# Patient Record
Sex: Female | Born: 1980 | Hispanic: Yes | Marital: Married | Smoking: Never smoker
Health system: Southern US, Community
[De-identification: ages and names within clinical notes are randomized; demographics above are authoritative.]

---

## 2007-03-30 ENCOUNTER — Emergency Department: Payer: Self-pay | Admitting: Emergency Medicine

## 2010-10-18 ENCOUNTER — Ambulatory Visit: Payer: Self-pay | Admitting: Family Medicine

## 2011-02-22 ENCOUNTER — Ambulatory Visit: Payer: Self-pay | Admitting: Advanced Practice Midwife

## 2011-04-06 ENCOUNTER — Inpatient Hospital Stay: Payer: Self-pay | Admitting: Obstetrics and Gynecology

## 2013-11-20 LAB — OB RESULTS CONSOLE HGB/HCT, BLOOD: Hemoglobin: 12.2 g/dL

## 2014-07-03 LAB — OB RESULTS CONSOLE ANTIBODY SCREEN: ANTIBODY SCREEN: NEGATIVE

## 2014-07-03 LAB — OB RESULTS CONSOLE ABO/RH: RH TYPE: POSITIVE

## 2014-07-08 LAB — OB RESULTS CONSOLE HGB/HCT, BLOOD: HEMOGLOBIN: 13.1 g/dL

## 2014-07-09 LAB — OB RESULTS CONSOLE RPR: RPR: NONREACTIVE

## 2014-07-09 LAB — OB RESULTS CONSOLE HEPATITIS B SURFACE ANTIGEN: Hepatitis B Surface Ag: NEGATIVE

## 2014-07-09 LAB — OB RESULTS CONSOLE PLATELET COUNT: Platelets: 293 10*3/uL

## 2014-08-24 ENCOUNTER — Ambulatory Visit: Payer: Self-pay | Admitting: Advanced Practice Midwife

## 2014-08-26 NOTE — L&D Delivery Note (Signed)
VAGINAL DELIVERY NOTE:  Date of Delivery: 02/02/2015 Primary OB: [facility]  Gestational Age/EDD: [redacted]w[redacted]d 02/15/2015, by Last Menstrual Period Antepartum complications: none Attending Physician: TJ Schermerhorn Delivery Type: spontaneous vaginal delivery  Anesthesia: none Laceration: none Episiotomy: none Placenta: spontaneous Intrapartum complications: None Estimated Blood Loss: GBS: negative Procedure Details:Cx ant lip and pt began to push and prepared for delivery and poss shoulder dystocia with 2 RN;s at bedside, Nsy nurse here and Peds available in next room, Interpreter with [place] and pt put in [place] and enc to push and the lip moved easily and baby delivered in 3 pushes with vtx presenting and ant shoulder in one movement, No CAN, post shoulder and body del. To mom's abd. CCx2 and cut after cord stopped pulsating. 3 VC noted. SDOP intact with fundus firm. Pt tol well. No lacs. No sponges or needels. Pt was cathed for 300 ml prior to del. Bleeding controlled.   Baby: Liveborn female, Apgars 9-9 weight 7 #, 8oz, baby named Namias

## 2014-11-22 LAB — OB RESULTS CONSOLE RPR: RPR: NONREACTIVE

## 2014-11-22 LAB — OB RESULTS CONSOLE GBS: GBS: NEGATIVE

## 2014-11-28 ENCOUNTER — Ambulatory Visit
Admit: 2014-11-28 | Disposition: A | Discharge status: Home / Self Care | Payer: Self-pay | Attending: Advanced Practice Midwife | Admitting: Advanced Practice Midwife

## 2015-01-03 ENCOUNTER — Other Ambulatory Visit: Payer: Self-pay | Admitting: Advanced Practice Midwife

## 2015-01-10 ENCOUNTER — Other Ambulatory Visit: Payer: Self-pay | Admitting: Advanced Practice Midwife

## 2015-01-10 DIAGNOSIS — O09299 Supervision of pregnancy with other poor reproductive or obstetric history, unspecified trimester: Secondary | ICD-10-CM

## 2015-01-10 DIAGNOSIS — Z3689 Encounter for other specified antenatal screening: Secondary | ICD-10-CM

## 2015-01-17 LAB — OB RESULTS CONSOLE GC/CHLAMYDIA
CHLAMYDIA, DNA PROBE: NEGATIVE
Gonorrhea: NEGATIVE

## 2015-01-17 LAB — OB RESULTS CONSOLE GBS: STREP GROUP B AG: NEGATIVE

## 2015-02-01 ENCOUNTER — Inpatient Hospital Stay
Admission: EM | Admit: 2015-02-01 | Discharge: 2015-02-04 | DRG: 775 | Disposition: A | Discharge status: Home / Self Care | Payer: Medicaid Other | Attending: Obstetrics and Gynecology | Admitting: Obstetrics and Gynecology

## 2015-02-01 ENCOUNTER — Emergency Department
Admission: EM | Admit: 2015-02-01 | Discharge: 2015-02-01 | Discharge status: Absent without leave | Payer: PRIVATE HEALTH INSURANCE

## 2015-02-01 ENCOUNTER — Ambulatory Visit
Admission: RE | Admit: 2015-02-01 | Discharge: 2015-02-01 | Disposition: A | Discharge status: Home / Self Care | Payer: Self-pay | Source: Ambulatory Visit | Attending: Advanced Practice Midwife | Admitting: Advanced Practice Midwife

## 2015-02-01 DIAGNOSIS — O3663X0 Maternal care for excessive fetal growth, third trimester, not applicable or unspecified: Secondary | ICD-10-CM | POA: Insufficient documentation

## 2015-02-01 DIAGNOSIS — Z3A38 38 weeks gestation of pregnancy: Secondary | ICD-10-CM | POA: Diagnosis present

## 2015-02-01 DIAGNOSIS — O09299 Supervision of pregnancy with other poor reproductive or obstetric history, unspecified trimester: Secondary | ICD-10-CM

## 2015-02-01 DIAGNOSIS — Z550 Illiteracy and low-level literacy: Secondary | ICD-10-CM

## 2015-02-01 DIAGNOSIS — O4103X0 Oligohydramnios, third trimester, not applicable or unspecified: Principal | ICD-10-CM | POA: Diagnosis present

## 2015-02-01 DIAGNOSIS — O4100X0 Oligohydramnios, unspecified trimester, not applicable or unspecified: Secondary | ICD-10-CM | POA: Diagnosis present

## 2015-02-01 LAB — TYPE AND SCREEN
ABO/RH(D): A POS
Antibody Screen: NEGATIVE

## 2015-02-01 LAB — CBC
HCT: 39.3 % (ref 35.0–47.0)
Hemoglobin: 12.8 g/dL (ref 12.0–16.0)
MCH: 29.5 pg (ref 26.0–34.0)
MCHC: 32.7 g/dL (ref 32.0–36.0)
MCV: 90.2 fL (ref 80.0–100.0)
PLATELETS: 183 10*3/uL (ref 150–440)
RBC: 4.35 MIL/uL (ref 3.80–5.20)
RDW: 15.2 % — AB (ref 11.5–14.5)
WBC: 11.4 10*3/uL — ABNORMAL HIGH (ref 3.6–11.0)

## 2015-02-01 LAB — ABO/RH: ABO/RH(D): A POS

## 2015-02-01 MED ORDER — TERBUTALINE SULFATE 1 MG/ML IJ SOLN: 0.2500 mg | Freq: Once | INTRAMUSCULAR | Status: AC | PRN

## 2015-02-01 MED ORDER — CITRIC ACID-SODIUM CITRATE 334-500 MG/5ML PO SOLN: 30.0000 mL | ORAL | Status: DC | PRN

## 2015-02-01 MED ORDER — DINOPROSTONE 10 MG VA INST
10.0000 mg | VAGINAL_INSERT | Freq: Once | VAGINAL | Status: AC
  Administered 2015-02-01: 10 mg via VAGINAL
  Filled 2015-02-01 (×2): qty 1

## 2015-02-01 MED ORDER — LACTATED RINGERS IV SOLN
INTRAVENOUS | Status: DC
  Administered 2015-02-01 – 2015-02-02 (×2): via INTRAVENOUS

## 2015-02-01 MED ORDER — BUTORPHANOL TARTRATE 1 MG/ML IJ SOLN
1.0000 mg | INTRAMUSCULAR | Status: DC | PRN
  Administered 2015-02-02: 1 mg via INTRAVENOUS

## 2015-02-01 MED ORDER — ZOLPIDEM TARTRATE 5 MG PO TABS: 5.0000 mg | ORAL_TABLET | Freq: Every evening | ORAL | Status: DC | PRN

## 2015-02-01 MED ORDER — OXYTOCIN BOLUS FROM INFUSION: 500.0000 mL | INTRAVENOUS | Status: DC

## 2015-02-01 MED ORDER — ACETAMINOPHEN 325 MG PO TABS: 650.0000 mg | ORAL_TABLET | ORAL | Status: DC | PRN

## 2015-02-01 MED ORDER — LACTATED RINGERS IV SOLN: 500.0000 mL | INTRAVENOUS | Status: DC | PRN

## 2015-02-01 MED ORDER — OXYTOCIN 40 UNITS IN LACTATED RINGERS INFUSION - SIMPLE MED
1.0000 m[IU]/min | INTRAVENOUS | Status: DC
  Administered 2015-02-02: 1 m[IU]/min via INTRAVENOUS

## 2015-02-01 MED ORDER — OXYTOCIN 40 UNITS IN LACTATED RINGERS INFUSION - SIMPLE MED
62.5000 mL/h | INTRAVENOUS | Status: DC
  Administered 2015-02-02: 62.5 mL/h via INTRAVENOUS

## 2015-02-01 MED ORDER — PRENATAL MULTIVITAMIN CH
1.0000 | ORAL_TABLET | Freq: Every day | ORAL | Status: DC
  Filled 2015-02-01 (×2): qty 1

## 2015-02-01 MED ORDER — OXYCODONE-ACETAMINOPHEN 5-325 MG PO TABS: 2.0000 | ORAL_TABLET | ORAL | Status: DC | PRN

## 2015-02-01 MED ORDER — ONDANSETRON HCL 4 MG/2ML IJ SOLN: 4.0000 mg | Freq: Four times a day (QID) | INTRAMUSCULAR | Status: DC | PRN

## 2015-02-01 MED ORDER — OXYCODONE-ACETAMINOPHEN 5-325 MG PO TABS: 1.0000 | ORAL_TABLET | ORAL | Status: DC | PRN

## 2015-02-01 MED ORDER — MISOPROSTOL 25 MCG QUARTER TABLET: 25.0000 ug | ORAL_TABLET | ORAL | Status: DC | PRN

## 2015-02-01 MED ORDER — LIDOCAINE HCL (PF) 1 % IJ SOLN: 30.0000 mL | INTRAMUSCULAR | Status: DC | PRN

## 2015-02-01 NOTE — H&P (Signed)
Caitlin Ramsey is a 34 y.o. female presenting for oligohydramnios with AFI 4.7cm.  Prior hx of shoulder dystocia with 9#13oz baby, and was getting growth scan today . Maternal Medical History:  Reason for admission: Nausea.  Fetal activity: Perceived fetal activity is normal.   Last perceived fetal movement was within the past hour.    Prenatal complications: Oligohydramnios.   Prenatal Complications - Diabetes: none.    OB History    Gravida Para Term Preterm AB TAB SAB Ectopic Multiple Living   7 4 4  2  2   4      No past medical history on file. No past surgical history on file. Family History: family history is not on file. Social History:  reports that she has never smoked. She does not have any smokeless tobacco history on file. She reports that she does not drink alcohol or use illicit drugs.   Prenatal Transfer Tool  Maternal Diabetes: No Genetic Screening: Normal Maternal Ultrasounds/Referrals: Abnormal:  Findings:   Other: oligohydramnios Fetal Ultrasounds or other Referrals:  None Maternal Substance Abuse:  No Significant Maternal Medications:  None Significant Maternal Lab Results:  None Other Comments:  None  Review of Systems  Constitutional: Negative for fever and chills.  Eyes: Negative for blurred vision and double vision.  Respiratory: Negative for shortness of breath.   Cardiovascular: Negative for chest pain and palpitations.  Gastrointestinal: Negative for nausea, vomiting, abdominal pain, diarrhea and constipation.  Genitourinary: Negative for dysuria, urgency, frequency and flank pain.  Neurological: Negative for headaches.  Psychiatric/Behavioral: Negative for depression.    Dilation: 3 Effacement (%): 50 Station: -3 Consistency: moderate Position: mid-position Modified Bishop score: 5  Exam by:: Virgel ManifoldB. Mairlyn Tegtmeyer, MD Blood pressure 114/60, pulse 109, temperature 97.9 F (36.6 C), temperature source Oral, resp. rate 18. Maternal  Exam:  Abdomen: Patient reports no abdominal tenderness. Estimated fetal weight is 3200.   Fetal presentation: vertex  Introitus: Normal vulva. Normal vagina.  Pelvis: adequate for delivery.   Cervix: Cervix evaluated by digital exam.     Physical Exam  Constitutional: She is oriented to person, place, and time. She appears well-developed and well-nourished. No distress.  Eyes: No scleral icterus.  Neck: Normal range of motion. Neck supple.  Cardiovascular: Normal rate.   Respiratory: Effort normal. No respiratory distress.  GI: Soft. She exhibits no distension. There is no tenderness.  Genitourinary: Vagina normal and uterus normal.  Musculoskeletal: Normal range of motion.  Neurological: She is alert and oriented to person, place, and time.  Skin: Skin is warm and dry.  Psychiatric: She has a normal mood and affect.    Prenatal labs: ABO, Rh: A/Positive/-- (11/08 0000) Antibody: Negative (11/08 0000) Rubella:   unknown  RPR: Nonreactive (03/29 0000)  HBsAg:   negative HIV:   drawn GBS: Negative (03/29 0000)   Assessment/Plan: Induction of labor for oligohydramnios with AFI 4.7cm Cervical ripening with Cervadil Pitocin titration Stadol 1-2mg  q1-2 hrs prn May have epidural when she wants it If CST fails, pt is aware that C/S will be indicated for failure to tolerate labor  Seen and consented with Spanish interpreter  Christeen DouglasBEASLEY, Rosie Golson 02/01/2015, 9:16 PM

## 2015-02-01 NOTE — ED Notes (Signed)
Per interp. US called pt to come here for an induction after seeing her low fluid levels on the US. L&D is calling me back because they do not have her down for an induction tonight or tomorrow.

## 2015-02-02 MED ORDER — TETANUS-DIPHTH-ACELL PERTUSSIS 5-2.5-18.5 LF-MCG/0.5 IM SUSP
0.5000 mL | Freq: Once | INTRAMUSCULAR | Status: DC
  Filled 2015-02-02: qty 0.5

## 2015-02-02 MED ORDER — SODIUM CHLORIDE 0.9 % IV SOLN: 250.0000 mL | INTRAVENOUS | Status: DC | PRN

## 2015-02-02 MED ORDER — LIDOCAINE HCL (PF) 1 % IJ SOLN
INTRAMUSCULAR | Status: AC
  Filled 2015-02-02: qty 30

## 2015-02-02 MED ORDER — WITCH HAZEL-GLYCERIN EX PADS
1.0000 | MEDICATED_PAD | CUTANEOUS | Status: DC | PRN
Start: 2015-02-02 — End: 2015-02-04

## 2015-02-02 MED ORDER — DIPHENHYDRAMINE HCL 25 MG PO CAPS: 25.0000 mg | ORAL_CAPSULE | Freq: Four times a day (QID) | ORAL | Status: DC | PRN

## 2015-02-02 MED ORDER — BENZOCAINE-MENTHOL 20-0.5 % EX AERO: 1.0000 "application " | INHALATION_SPRAY | CUTANEOUS | Status: DC | PRN

## 2015-02-02 MED ORDER — BISACODYL 10 MG RE SUPP: 10.0000 mg | Freq: Every day | RECTAL | Status: DC | PRN

## 2015-02-02 MED ORDER — OXYCODONE-ACETAMINOPHEN 5-325 MG PO TABS: 2.0000 | ORAL_TABLET | ORAL | Status: DC | PRN

## 2015-02-02 MED ORDER — ACETAMINOPHEN 325 MG PO TABS: 650.0000 mg | ORAL_TABLET | ORAL | Status: DC | PRN

## 2015-02-02 MED ORDER — OXYTOCIN 40 UNITS IN LACTATED RINGERS INFUSION - SIMPLE MED
INTRAVENOUS | Status: AC
  Filled 2015-02-02: qty 1000

## 2015-02-02 MED ORDER — PRENATAL MULTIVITAMIN CH
1.0000 | ORAL_TABLET | Freq: Every day | ORAL | Status: DC
  Administered 2015-02-03 – 2015-02-04 (×2): 1 via ORAL
  Filled 2015-02-02 (×3): qty 1

## 2015-02-02 MED ORDER — SODIUM CHLORIDE 0.9 % IJ SOLN: 3.0000 mL | INTRAMUSCULAR | Status: DC | PRN

## 2015-02-02 MED ORDER — SENNOSIDES-DOCUSATE SODIUM 8.6-50 MG PO TABS
2.0000 | ORAL_TABLET | ORAL | Status: DC
  Administered 2015-02-04: 2 via ORAL
  Filled 2015-02-02 (×3): qty 2

## 2015-02-02 MED ORDER — OXYCODONE-ACETAMINOPHEN 5-325 MG PO TABS: 1.0000 | ORAL_TABLET | ORAL | Status: DC | PRN

## 2015-02-02 MED ORDER — BUTORPHANOL TARTRATE 1 MG/ML IJ SOLN: 1.0000 mg | INTRAMUSCULAR | Status: DC | PRN

## 2015-02-02 MED ORDER — DIBUCAINE 1 % RE OINT: 1.0000 "application " | TOPICAL_OINTMENT | RECTAL | Status: DC | PRN

## 2015-02-02 MED ORDER — SODIUM CHLORIDE 0.9 % IJ SOLN: 3.0000 mL | Freq: Two times a day (BID) | INTRAMUSCULAR | Status: DC

## 2015-02-02 MED ORDER — SIMETHICONE 80 MG PO CHEW
80.0000 mg | CHEWABLE_TABLET | ORAL | Status: DC | PRN
  Filled 2015-02-02: qty 1

## 2015-02-02 MED ORDER — ONDANSETRON HCL 4 MG PO TABS
4.0000 mg | ORAL_TABLET | ORAL | Status: DC | PRN
  Filled 2015-02-02: qty 1

## 2015-02-02 MED ORDER — ONDANSETRON HCL 4 MG/2ML IJ SOLN: 4.0000 mg | INTRAMUSCULAR | Status: DC | PRN

## 2015-02-02 MED ORDER — MEASLES, MUMPS & RUBELLA VAC ~~LOC~~ INJ
0.5000 mL | INJECTION | Freq: Once | SUBCUTANEOUS | Status: DC
  Filled 2015-02-02: qty 0.5

## 2015-02-02 MED ORDER — BUTORPHANOL TARTRATE 1 MG/ML IJ SOLN
INTRAMUSCULAR | Status: AC
  Administered 2015-02-02: 1 mg via INTRAVENOUS
  Filled 2015-02-02: qty 1

## 2015-02-02 MED ORDER — MISOPROSTOL 200 MCG PO TABS
ORAL_TABLET | ORAL | Status: AC
  Filled 2015-02-02: qty 4

## 2015-02-02 MED ORDER — OXYTOCIN 10 UNIT/ML IJ SOLN
INTRAMUSCULAR | Status: AC
  Filled 2015-02-02: qty 2

## 2015-02-02 MED ORDER — ZOLPIDEM TARTRATE 5 MG PO TABS: 5.0000 mg | ORAL_TABLET | Freq: Every evening | ORAL | Status: DC | PRN

## 2015-02-02 MED ORDER — LANOLIN HYDROUS EX OINT: TOPICAL_OINTMENT | CUTANEOUS | Status: DC | PRN

## 2015-02-02 MED ORDER — AMMONIA AROMATIC IN INHA
RESPIRATORY_TRACT | Status: AC
  Filled 2015-02-02: qty 10

## 2015-02-02 MED ORDER — LACTATED RINGERS IV SOLN: INTRAVENOUS | Status: DC

## 2015-02-02 MED ORDER — OXYTOCIN 40 UNITS IN LACTATED RINGERS INFUSION - SIMPLE MED: 62.5000 mL/h | INTRAVENOUS | Status: DC | PRN

## 2015-02-02 MED ORDER — FLEET ENEMA 7-19 GM/118ML RE ENEM: 1.0000 | ENEMA | Freq: Every day | RECTAL | Status: DC | PRN

## 2015-02-02 MED ORDER — IBUPROFEN 600 MG PO TABS
600.0000 mg | ORAL_TABLET | Freq: Four times a day (QID) | ORAL | Status: DC
  Administered 2015-02-03 – 2015-02-04 (×6): 600 mg via ORAL
  Filled 2015-02-02 (×7): qty 1

## 2015-02-02 NOTE — Progress Notes (Signed)
Recvd per shift report.  Up to BR with no assist.  Back to bed.  EFM continues.  Shift assessment performed.

## 2015-02-02 NOTE — Progress Notes (Signed)
Shacarra Foat is a 34 y.o. [id number] at [redacted]w[redacted]d by LMP admitted for induction of labor due to Low amniotic fluid..[place] correlates with EDD.  Subjective: No pain with UC's  Objective: BP 102/58 mmHg  Pulse 93  Temp(Src) 97.9 F (36.6 C) (Oral)  Resp 14  Ht 5' 4.5" (1.638 m)  Wt 83.462 kg (184 lb)  BMI 31.11 kg/m2  LMP 05/11/2014      FHT:  FHR: 150, accels tp 170 noted, Cat I bpm, variability: moderate,  accelerations:  Present,  decelerations:  Absent UC:   regular, every 7  minutes SVE:   Dilation: 3 Effacement (%): 50 Exam by:: Eugene Garnet, RN  Labs: Lab Results  Component Value Date   WBC 11.4* 02/01/2015   HGB 12.8 02/01/2015   HCT 39.3 02/01/2015   MCV 90.2 02/01/2015   PLT 183 02/01/2015    Assessment / Plan: Induction of labor due to Oligo,  progressing well on pitocin  Labor: Progressing normally Preeclampsia:  no signs or symptoms of toxicity Fetal Wellbeing:  Category I Pain Control:  Labor support without medications I/D:  n/a Anticipated MOD:  NSVD  Disc with Dr Feliberto Gottron and Dr Dalbert Garnet and agree with proceeding with IOL for Oligo and vaginal delivery. Interpreter, Maryjane Hurter, discussed with CNM and pt the risks of vaginal delivery due to previous hx of shoulder dystocia with a 9#13oz baby. [place] yest. Showed a 4000 gm baby and EFW by Dr Dalbert Garnet and CNM estimated at 6#14oz. Pt states she does not feel as big as last pregnancy. Pt was given and option to delivery via LTCS to avoid any complications with poss shoulder dystocia including HIE, brachial plexus, arm deformity, fx of shoulder, fetal anoxia, extensive perineal injury due to shoulder dystocia and other complications. Pt is aware of the risks and desires to proceed to continuing vag delivery options. Cervidil is in place and pt is contracting. All MD's, nursing and CNM are on board to provide pt's desire to delivery SVD. Pt stated that if she has to have a C-section for any reason, she is unsure whether  she wants her tubes tied and will discuss with partner.    Milon Score W 02/02/2015, 8:34 AM

## 2015-02-02 NOTE — Progress Notes (Signed)
Caitlin Ramsey is a 34 y.o. [id number] at [redacted]w[redacted]d by LMP admitted for induction of labor due to Low amniotic fluid..  Subjective:   Objective: BP 123/56 mmHg  Pulse 94  Temp(Src) 98.3 F (36.8 C) (Oral)  Resp 18  Ht 5' 4.5" (1.638 m)  Wt 83.462 kg (184 lb)  BMI 31.11 kg/m2  LMP 05/11/2014      FHT:  FHR: 150, mod variabilty, no decels. + accels bpm, variability: moderate,  accelerations:  Present,  decelerations:  Absent UC:   irregular, every 1-2 minutes, 8 on 1-10 scale per pt. Lasting 40-60 secs SVE:   Dilation: 5 Effacement (%): 70 Station: -1 Exam by:: Beatriz Stallion, CNM  Labs: Lab Results  Component Value Date   WBC 11.4* 02/01/2015   HGB 12.8 02/01/2015   HCT 39.3 02/01/2015   MCV 90.2 02/01/2015   PLT 183 02/01/2015    Assessment / Plan: Augmentation of labor, progressing well  Labor: Progressing normally Preeclampsia:  no signs or symptoms of toxicity Fetal Wellbeing:  Category I Pain Control:  Labor support without medications I/D:  n/a Anticipated MOD:  NSVD  Dr. Feliberto Gottron aware of pt status. Pitocin increased to 6 (    Mun/min)  Caitlin Ramsey 02/02/2015, 7:24 PM

## 2015-02-02 NOTE — Progress Notes (Addendum)
Caitlin Ramsey is a 34 y.o. [id number] at [redacted]w[redacted]d by LMP admitted for induction of labor due to Oligohydramnios.  Subjective: No complaints.  Objective: BP 113/55 mmHg  Pulse 91  Temp(Src) 98.7 F (37.1 C) (Oral)  Resp 16  Ht 5' 4.5" (1.638 m)  Wt 83.462 kg (184 lb)  BMI 31.11 kg/m2  LMP 05/11/2014      FHT:  FHR: 150, Cat 2 Variable decels after AROM to 100 x 20 secs, +accels prior to variables bpm, variability: moderate,  accelerations:  Present,  decelerations:  Present variables x 2 noted UC:   regular, every 2 minutes SVE:   Dilation: 3 Effacement (%): 60 Station: -2 Exam by:: Cjones, CNM  Labs: Lab Results  Component Value Date   WBC 11.4* 02/01/2015   HGB 12.8 02/01/2015   HCT 39.3 02/01/2015   MCV 90.2 02/01/2015   PLT 183 02/01/2015    Assessment / Plan: Augmentation of labor, progressing well  Labor: Progressing normally Preeclampsia:  none Fetal Wellbeing:  Category II Pain Control:  none I/D:  n/a Anticipated MOD:  NSVD  AROM for clear amt of fluid (small amt) noted on AROM with IFSE. FHR 140 after AROM.   Milon Score W 02/02/2015, 12:27 PM

## 2015-02-03 LAB — CBC
HCT: 38.4 % (ref 35.0–47.0)
Hemoglobin: 12.7 g/dL (ref 12.0–16.0)
MCH: 29.9 pg (ref 26.0–34.0)
MCHC: 33.2 g/dL (ref 32.0–36.0)
MCV: 90.1 fL (ref 80.0–100.0)
PLATELETS: 174 10*3/uL (ref 150–440)
RBC: 4.26 MIL/uL (ref 3.80–5.20)
RDW: 14.9 % — ABNORMAL HIGH (ref 11.5–14.5)
WBC: 20.8 10*3/uL — ABNORMAL HIGH (ref 3.6–11.0)

## 2015-02-03 LAB — RPR: RPR: NONREACTIVE

## 2015-02-03 LAB — HIV ANTIBODY (ROUTINE TESTING W REFLEX): HIV SCREEN 4TH GENERATION: NONREACTIVE

## 2015-02-03 NOTE — Progress Notes (Signed)
Post Partum Day 1 Subjective: no complaints  Objective: Blood pressure 96/53, pulse 79, temperature 98.7 F (37.1 C), temperature source Oral, resp. rate 20, height 5' 4.5" (1.638 m), weight 83.462 kg (184 lb), last menstrual period 05/11/2014, unknown if currently breastfeeding.  Physical Exam:  General: alert Lochia: appropriate Uterine Fundus: firm Incision: DVT Evaluation: No evidence of DVT seen on physical exam.   Recent Labs  02/01/15 2108  HGB 12.8  HCT 39.3    Assessment/Plan: Plan for discharge tomorrow   LOS: 2 days   SCHERMERHORN,THOMAS 02/03/2015, 8:25 AM

## 2015-02-03 NOTE — Care Management (Signed)
Received referral for information about Medicaid. Will need interpreter to explain Medicaid application process. Ms. Caitlin Ramsey is currently visiting the newborn in special care nursery. Gwenette Greet RN MSN Care Management [phone]

## 2015-02-04 MED ORDER — IBUPROFEN 600 MG PO TABS: 600.0000 mg | ORAL_TABLET | Freq: Four times a day (QID) | ORAL | Status: AC

## 2015-02-04 NOTE — Discharge Summary (Signed)
Obstetric Discharge Summary Reason for Admission: onset of labor Prenatal Procedures: none Intrapartum Procedures: spontaneous vaginal delivery Postpartum Procedures: none Complications-Operative and Postpartum: none HEMOGLOBIN  Date Value Ref Range Status  02/03/2015 12.7 12.0 - 16.0 g/dL Final  98/06/9146 82.9 g/dL Final   HCT  Date Value Ref Range Status  02/03/2015 38.4 35.0 - 47.0 % Final    Physical Exam:  General: alert and cooperative Lochia: appropriate Uterine Fundus: firm Incision: none DVT Evaluation: No evidence of DVT seen on physical exam.  Discharge Diagnoses: Term Pregnancy-delivered  Discharge Information: Date: 02/04/2015 Activity: pelvic rest Diet: routine Medications: Ibuprofen Condition: stable Instructions: refer to practice specific booklet Discharge to: home   Newborn Data: Live born female  Birth Weight: 7 lb 8.5 oz (3415 g) APGAR: 9, 9  Home with mother will be d/c and room in with baby until 6/12 16 so baby can finish ABX .  Shaneen Reeser 02/04/2015, 10:27 AM

## 2015-02-04 NOTE — Progress Notes (Signed)
pt discharged home with infant.  Discharge instructions, prescriptions and follow up appointment given to and reviewed with pt.  Pt verbalized understanding, all questions answered.  Escorted by auxiliary. 

## 2015-11-16 IMAGING — US US OB US >=[ID] SNGL FETUS
1 series · 13 of 28 positions shown · non-contrast
Comparison: none

CLINICAL DATA: Scan for anatomy. 33-year-old gravida 6 para 3.
Gestational age by LMP 28 weeks 5 days. Gestational age by first
ultrasound 29 weeks 0 days.

EXAM:
ULTRASOUND OB >=276KR SINGLE FETUS

[Series 1: us ob us >=(id) sngl fetus · 0.28mm/px · 13 of 80 slices shown]
[im 3/80]
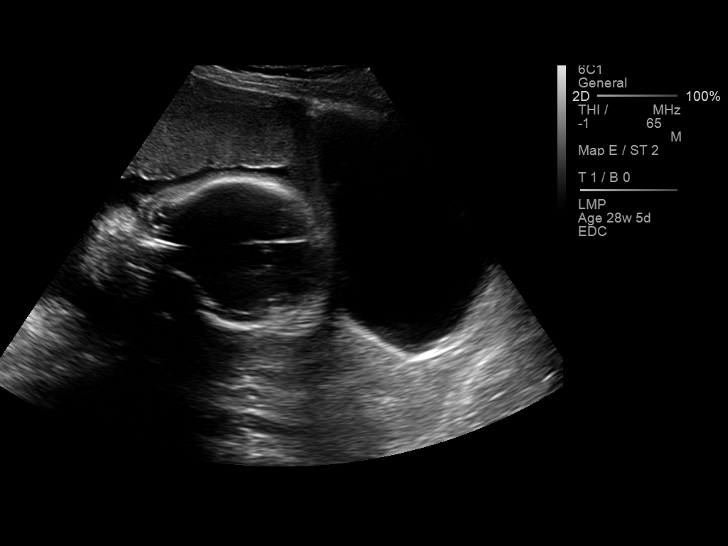
[im 9/80]
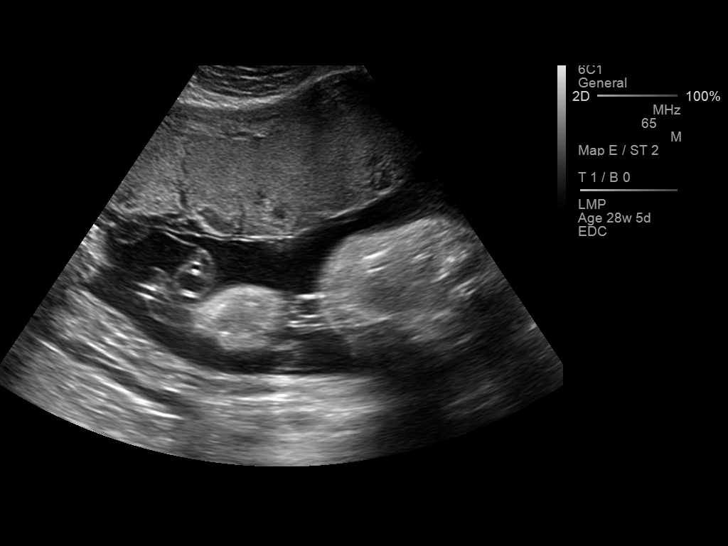
[im 15/80]
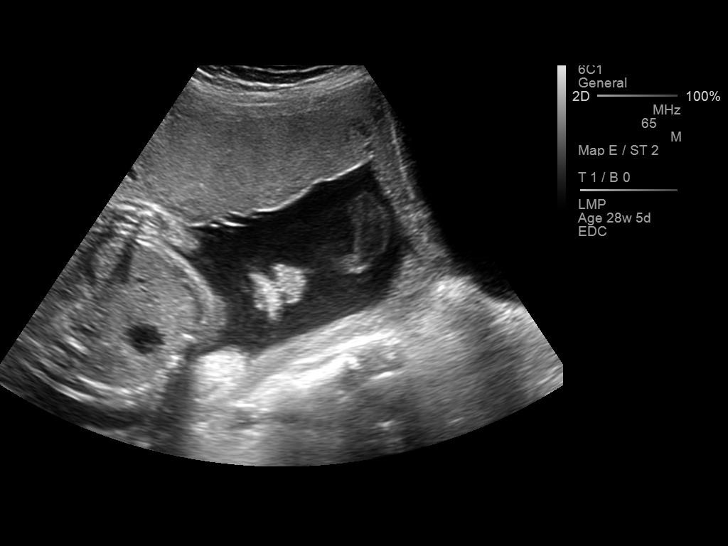
[im 21/80]
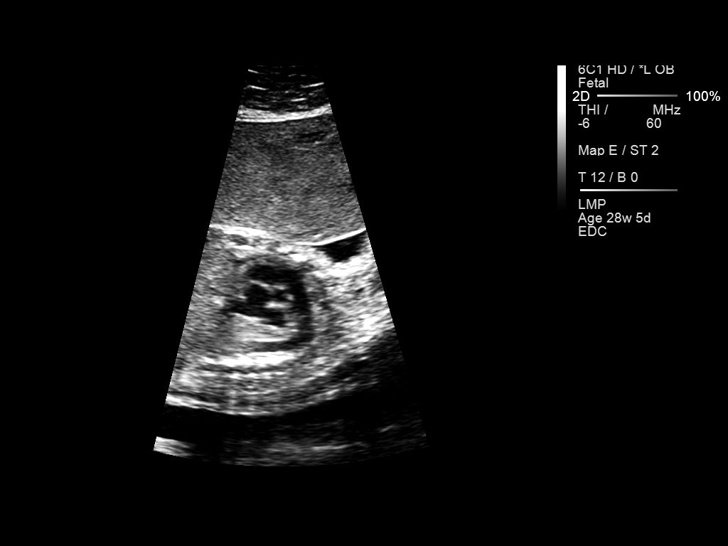
[im 27/80]
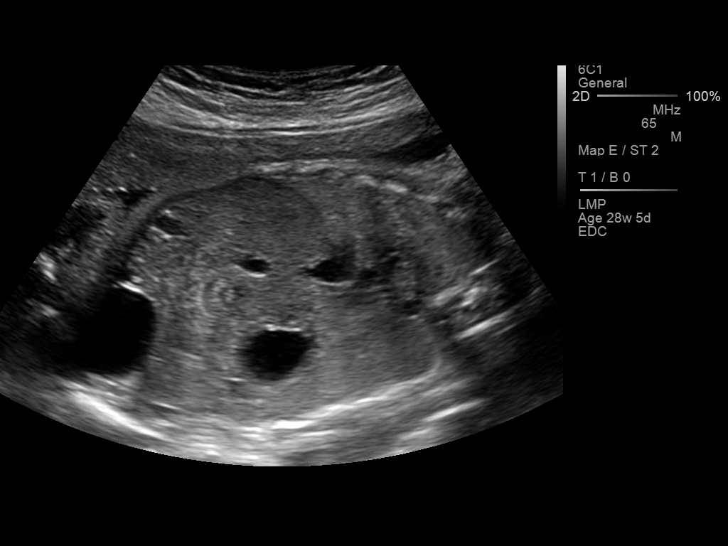
[im 33/80]
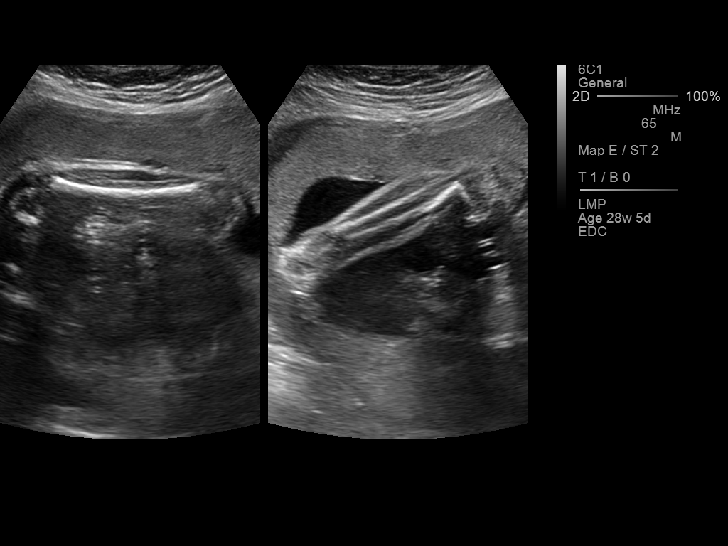
[im 41/80]
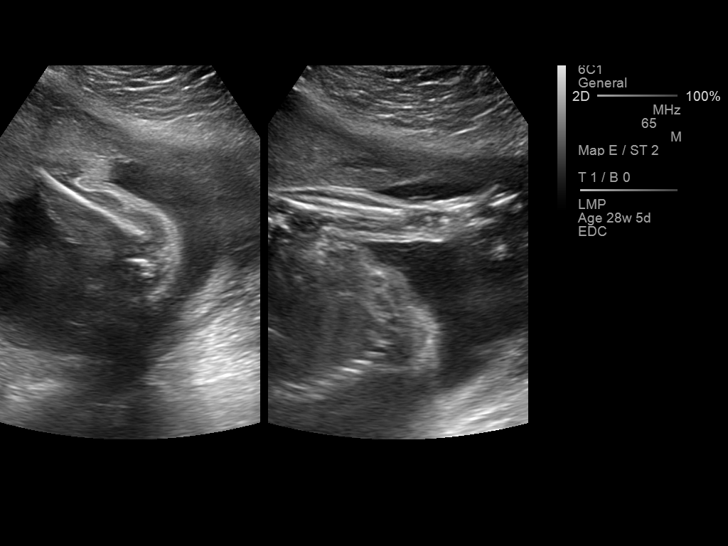
[im 47/80]
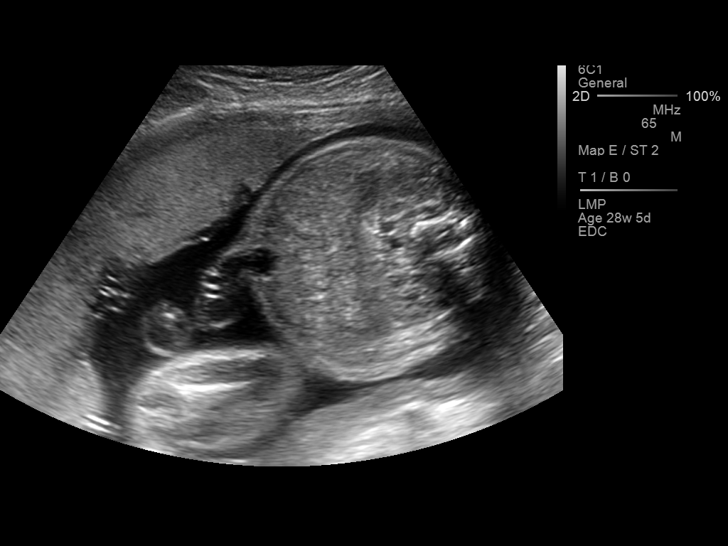
[im 53/80]
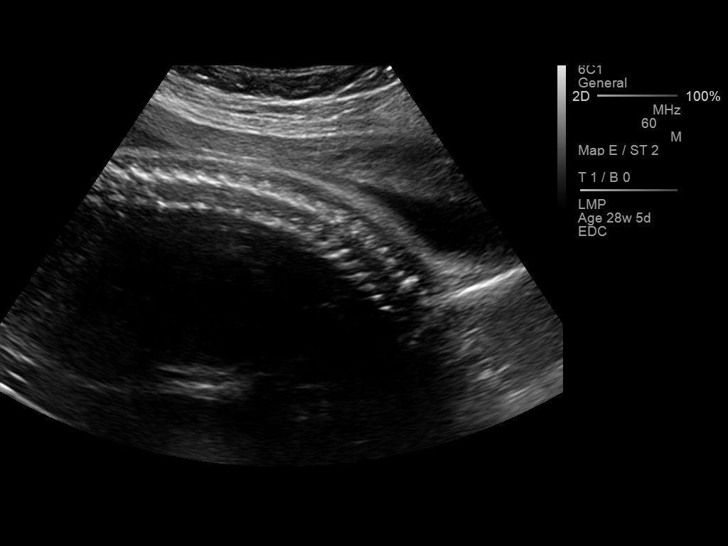
[im 59/80]
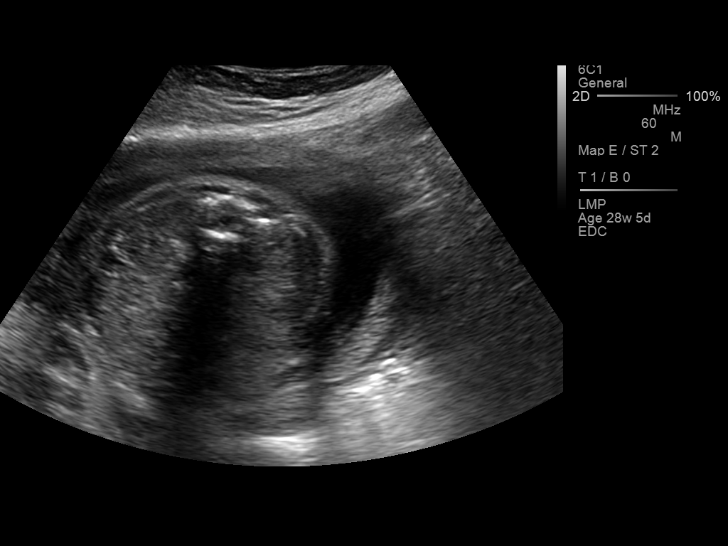
[im 65/80]
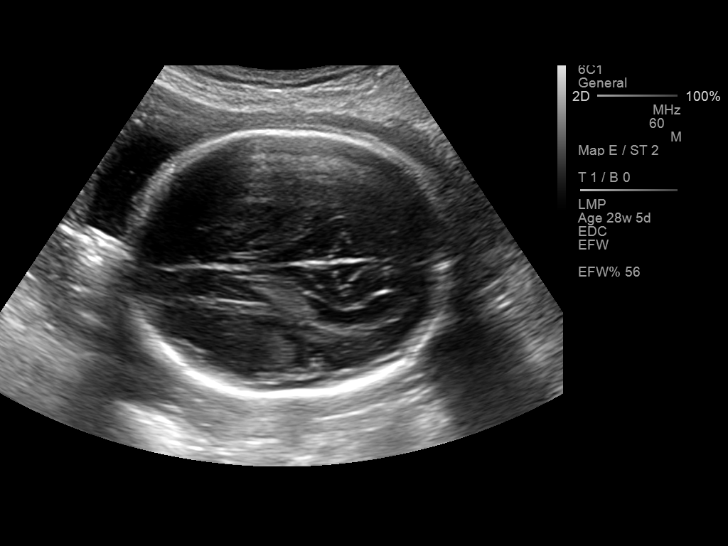
[im 71/80]
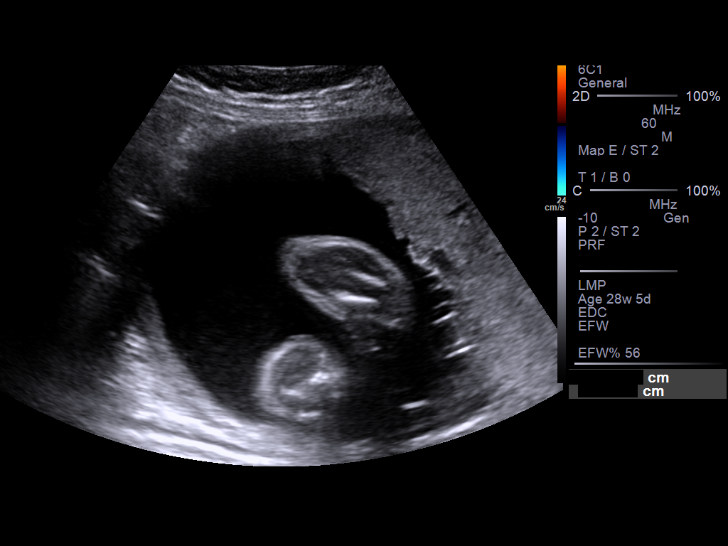
[im 77/80]
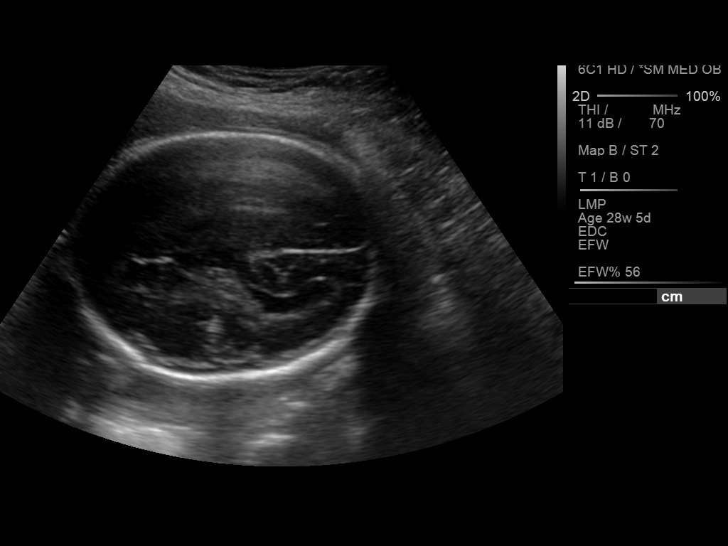

[13 of 28 positions shown; findings below may reference images not displayed]

FINDINGS: Number of Fetuses: 1

Heart Rate:  141 bpm

Movement: Present

Presentation: Cephalic

Previa: None

Placental Location: Anterior

Amniotic Fluid (Subjective): Normal

Amniotic Fluid (Objective):

AFI 16.8 cm (5%ile= 9.2 cm, 95%= 23.1 cm for 29 wks)

FETAL BIOMETRY

BPD:  7.5cm 29w 6d

HC:    27.7cm  30w   2d

AC:   24.8cm  29w   0d

FL:   5.5cm  29w   0d

Current Mean GA: 29w 3d              US EDC: 02/10/2015

Estimated Fetal Weight:  1354g    56%ile

FETAL ANATOMY

Lateral Ventricles: Visualized

Thalami/CSP: Visualized

Posterior Fossa:  Visualized

Nuchal Region: n/a

Upper Lip: Visualized

Spine: Visualized

4 Chamber Heart on Left: Visualized

LVOT: Visualized

RVOT: Visualized

Stomach on Left: Visualized

3 Vessel Cord: Visualized

Cord Insertion site: Visualized

Kidneys: Visualized

Bladder: Visualized

Extremities: Visualized

Sex: Male

Technically difficult due to: n/a

Maternal Findings:

Cervix:
IMPRESSION: 1. Single living intrauterine fetus in cephalic presentation.
2. Appropriate interval growth.
3. No fetal anomalies identified.

## 2016-04-22 IMAGING — US US OB FOLLOW-UP
1 series · 13 of 28 positions shown · non-contrast
Comparison: none

CLINICAL DATA: Current assigned gestational age of 38 weeks 6 days.
Previous large for gestational age fetus with shoulder dislocation.
Evaluate fetal growth.

EXAM:
OBSTETRIC 14+ WK ULTRASOUND FOLLOW-UP

[Series 1: us ob follow-up · 0.23mm/px · 13 of 73 slices shown]
[im 3/73]
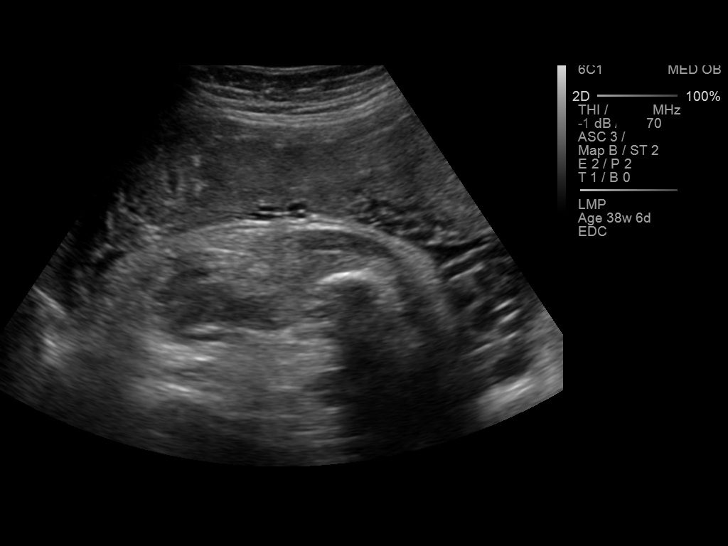
[im 9/73]
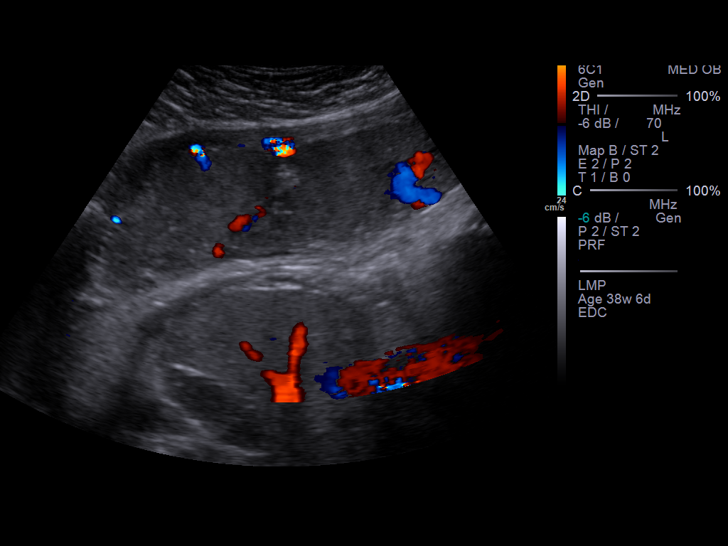
[im 14/73]
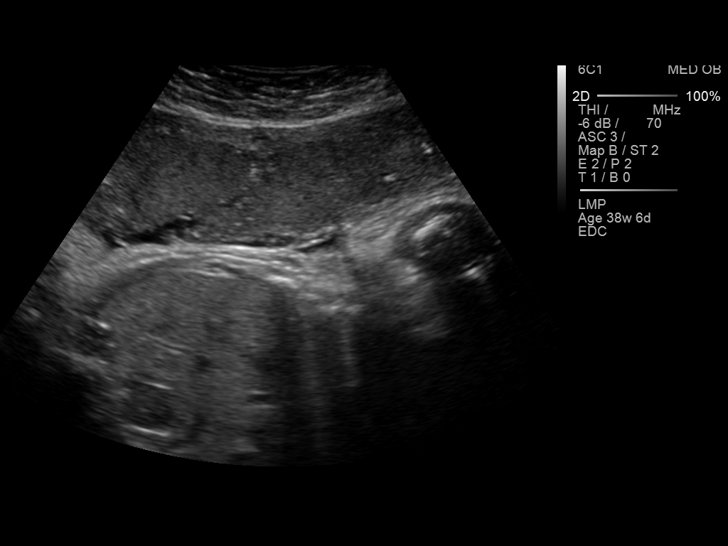
[im 19/73]
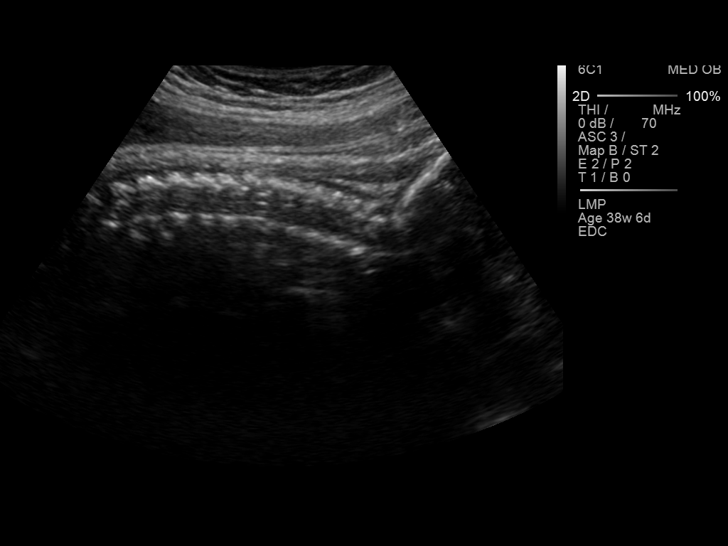
[im 25/73]
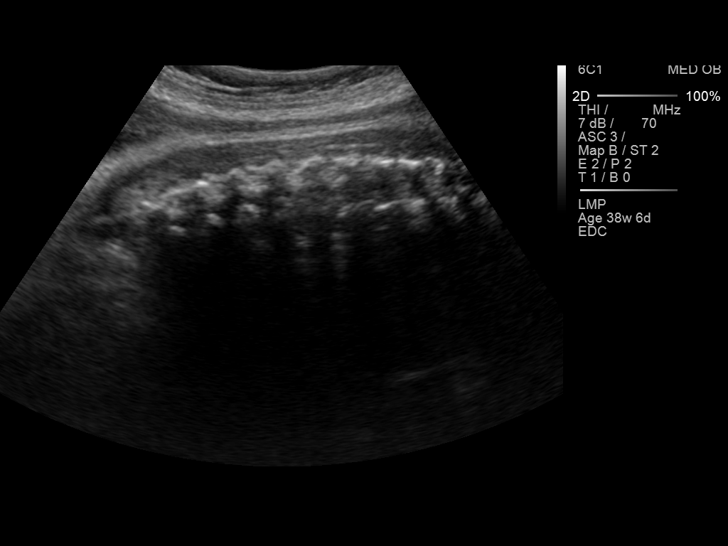
[im 30/73]
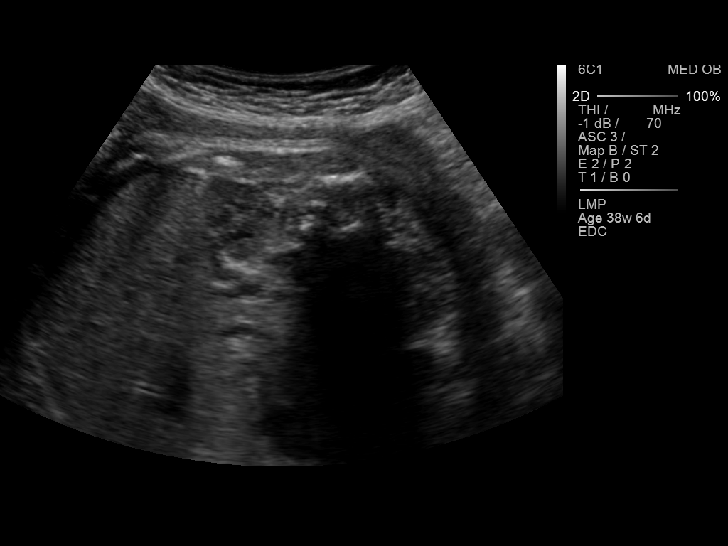
[im 38/73]
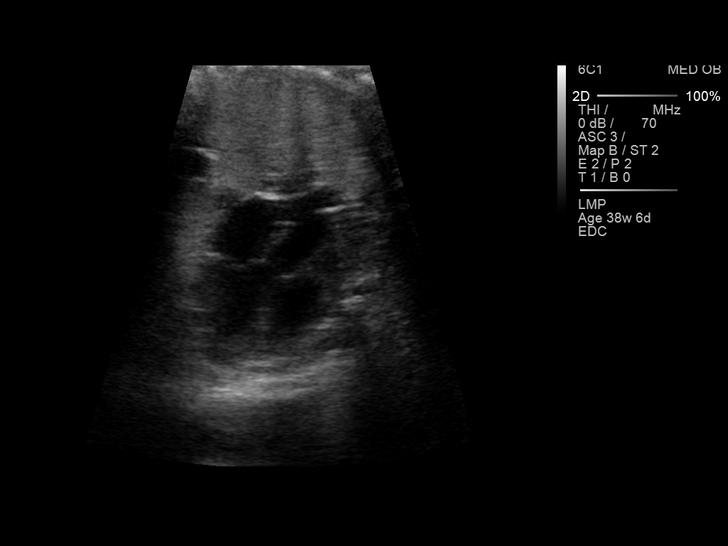
[im 43/73]
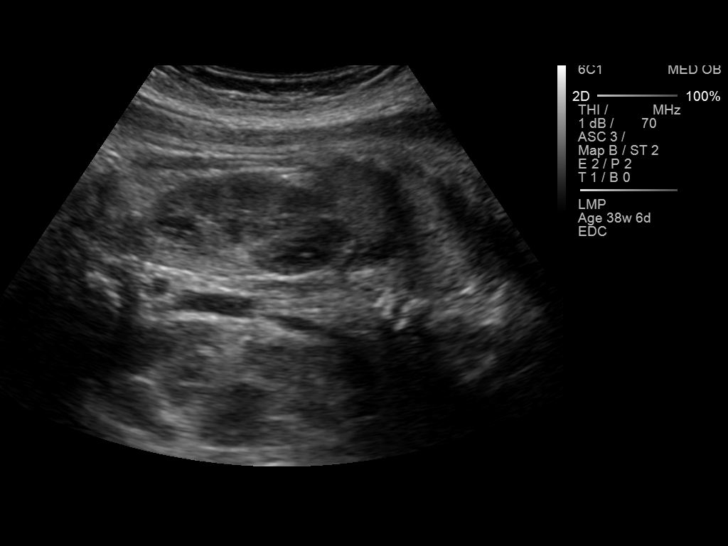
[im 49/73]
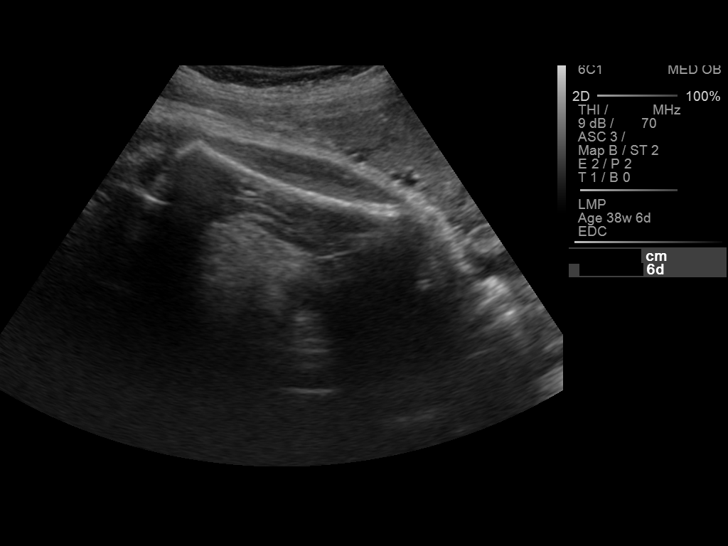
[im 54/73]
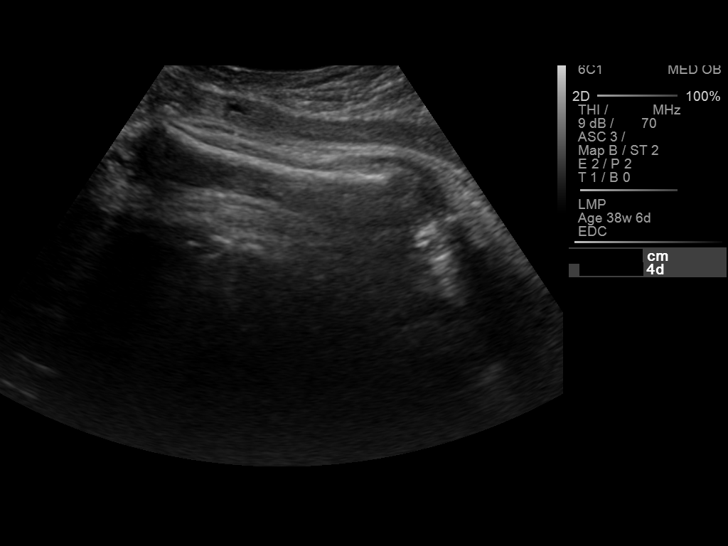
[im 59/73]
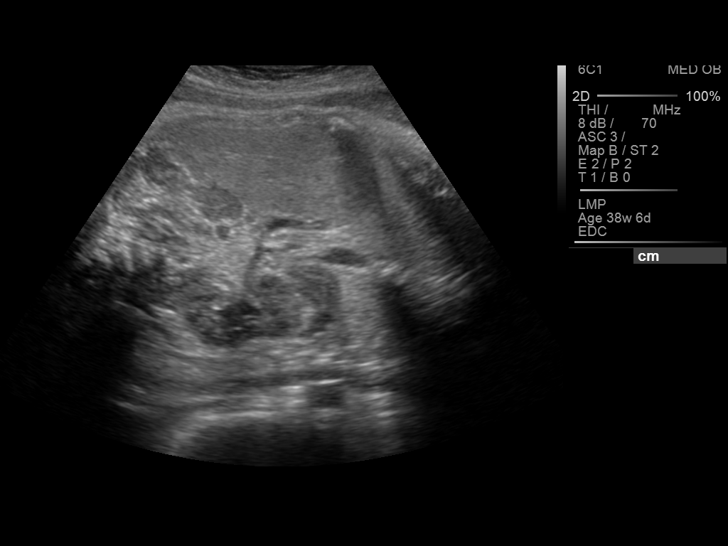
[im 65/73]
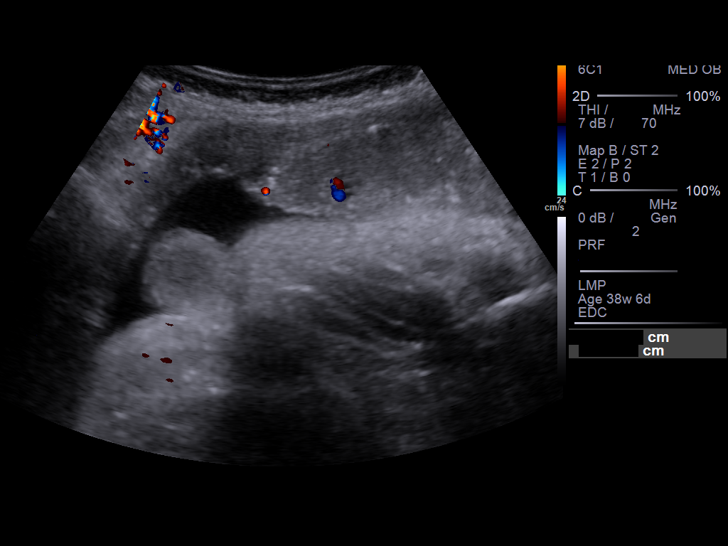
[im 70/73]
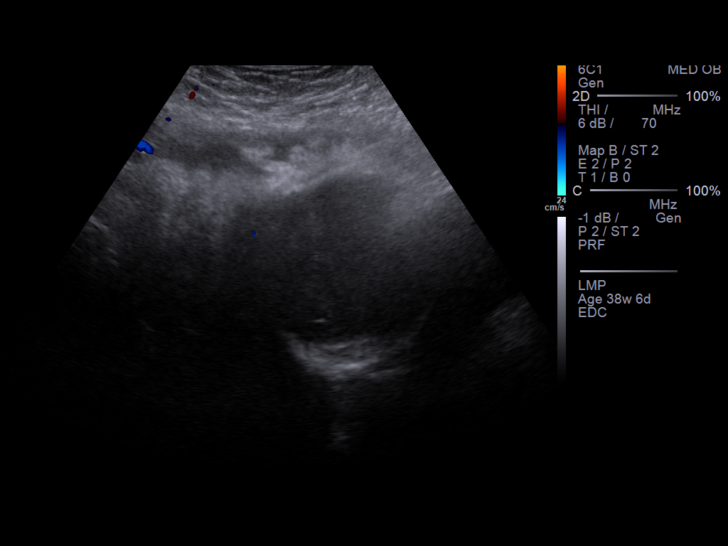

[13 of 28 positions shown; findings below may reference images not displayed]

FINDINGS: Number of Fetuses: 1

Heart Rate:  143 bpm

Movement: Gas

Presentation: Cephalic

Previa: No

Placental Location: Anterior

Amniotic Fluid (Subjective): Decreased

Amniotic Fluid (Objective):

AFI 4.7 cm

FETAL BIOMETRY

BPD:  9.2cm 37Drey   Tecco

HC:    34.1cm  39Drey   Tecco

AC:   38.1cm  42w   0d

FL:   6.6cm  38w   1d

Current Mean GA: 39w 0d          US EDC: 02/08/2015

Assigned GA:  38w 6d              Assigned EDC:  02/09/2015

Estimated Fetal Weight:  4,048g 93%ile

FETAL ANATOMY

Lateral Ventricles: Previously visualized

Thalami/CSP: Appears normal

Posterior Fossa:  Previously visualized

Nuchal Region: Previously visualized

Upper Lip: Previously visualized

Spine: Previously visualized

4 Chamber Heart on Left: Appears normal

LVOT: Appears normal

RVOT: Previously visualized

Stomach on Left: Appears normal

3 Vessel Cord: Previously visualized

Cord Insertion site: Previously visualized

Kidneys: Appear normal

Bladder: Appears normal

Extremities: Previously visualized

Sex: Male

Technically difficult due to: Advanced gestational age

MATERNAL FINDINGS:

Cervix: Not evaluated >34 weeks gestational age
IMPRESSION: Assigned gestational age is currently 38 weeks 6 days. Fetus
measures large for gestational age, with EFW currently at 93
percentile.

Subjectively decreased amniotic fluid volume, with AFI measuring
cm.
